# Patient Record
Sex: Female | Born: 1955 | Race: White | Hispanic: No | Marital: Married
Health system: Southern US, Community
[De-identification: ages and names within clinical notes are randomized; demographics above are authoritative.]

---

## 2000-06-04 ENCOUNTER — Inpatient Hospital Stay (HOSPITAL_COMMUNITY): Admission: RE | Admit: 2000-06-04 | Discharge: 2000-06-06 | Payer: Self-pay | Admitting: Obstetrics and Gynecology

## 2000-06-04 ENCOUNTER — Encounter (INDEPENDENT_AMBULATORY_CARE_PROVIDER_SITE_OTHER): Payer: Self-pay | Admitting: Specialist

## 2006-08-27 ENCOUNTER — Encounter: Admission: RE | Admit: 2006-08-27 | Discharge: 2006-08-27 | Payer: Self-pay | Admitting: Internal Medicine

## 2007-12-29 ENCOUNTER — Encounter: Admission: RE | Admit: 2007-12-29 | Discharge: 2007-12-29 | Payer: Self-pay | Admitting: Gastroenterology

## 2010-10-12 NOTE — Op Note (Signed)
[facility]  Patient:    Emily Vaughn, Emily Vaughn                    MRN: [id number] Proc. Date: 06/04/00 Adm. Date:  [id number] Attending:  Morene Antu                           Operative Report  PREOPERATIVE DIAGNOSIS:       Fibroid uterus.  POSTOPERATIVE DIAGNOSES:      1. Fibroid uterus.                               2. Left broad ligament cyst.  PROCEDURE:                    1. Multiple myomectomies.                               2. Excision of left broad ligament cyst.  SURGEON:                      Sherry A. Rosalio Macadamia, M.D. & Silverio Lay, M.D.  ANESTHESIA:                   General.  INDICATIONS:                  This is a 55 year old G 0, P 0 woman, who has periods regularly with light flow.  The patient has had a known fibroid uterus for several years; however, the patient now complains of increasing pelvic pressure, urinary frequency, and discomfort.  An ultrasound had been performed initially in June 2000, revealing a probable pedunculated fibroid that measures approximately 18.0 cm x 15.0 cm x 9.0 cm.  A fluid collection was also noted anterior to the uterus.  The patient was followed conservatively; however, because of the persistence and size of this fibroid, the patient requested surgical intervention.  She wanted conservative surgery, and not to have her uterus removed.  The patient was treated with Lupron for approximately three months, to decrease the size of her uterus.  The patient is now brought to the operating room for a myomectomy.  FINDINGS:                     Approximately a 16.0 cm pedunculated fibroid. Normal tubes and ovaries.  Multiple small fibroids present.  Left broad ligament cyst.  Significant vasculature in the left broad ligament.  DESCRIPTION OF PROCEDURE:     The patient was brought into the operating room and given adequate general anesthesia.  She was placed in a frog-leg position. Her abdomen and  vagina were washed with Betadine.  A Foley catheter was placed into the bladder. A speculum was placed within the vagina.  The cervix was grasped with a single tooth tenaculum.  A #8 pediatric Foley was introduced into the uterine endometrial cavity.  The Foley was packed in place with Betadine-soaked packing tapes, and the speculum and tenaculum were removed. The patient was taken out of the frog-leg position.  She was draped in a sterile fashion.  A Pfannenstiel incision was made and brought down sharply through the fascia.  Bleeders were cauterized.  The fascia was incised sharply.  The fascia was elevated off of the rectus muscles with sharp  and blunt dissection.  The rectus muscles were separated bluntly.  The peritoneum was identified and entered bluntly.  The peritoneal incision was made superiorly and inferiorly under direct visualization.  The pedunculated fibroid was then delivered through the abdominal cavity.  The base of it was injected with Pitressin.  The serosa at the base of the fibroid was sized and was dissected free off of the fibroid.  A Kelly clamp was then placed across the pedicle.  Using #0 Vicryl and interrupted stitches the pedicle was closed at the base for hemostasis.  A large venous lake system was present in the left broad ligament which had drained this fibroid.  The serosa was closed with #3-0 Vicryl in a baseball-type stitch.  There was a small fibroid present on the anterior wall of the uterus.  This was incised and dissected free.  The serosa was closed with #3-0 Vicryl in a baseball stitch, after cauterizing the base for hemostasis.  There was a cyst present in the left broad ligament. The anterior leaf of the broad ligament was incised, and the cyst was dissected with blunt and sharp dissection.  The cyst was ruptured during the dissection, and as much of the cyst wall was removed as possible.  Smaller bleeders were cauterized.  A small area of bleeding  was clamped with a tonsil, and it was free-tied with #0 Vicryl free tie.  There was a small cyst beneath the left fallopian tube.  This also was excised.  There was some bleeding present and some venous system after excision.  This was stopped by carefully placing a #3-0 Vicryl mattress suture beneath the fallopian tube, and adequate hemostasis was present.  A small fibroid was cauterized off of the wall of the uterus, after having packed off the bowel completely without difficulty.  A Balfour retractor had been placed within the abdomen prior, after the removal of the pedunculated fibroid for adequate exposure.  Some adhesions were dissected behind the uterus.  A fibroid was present on the right side wall of the uterus.  This was infiltrated with Pitressin.  The serosa was incised.  It was grasped with a towel clamp, and the fibroid was dissected free and cauterized away from the uterus.  The serosa was then closed with #3-0 Vicryl in a baseball-type stitch.  Adequate hemostasis was present throughout.  The indigo carmine dye was injected through the pediatric Foley in the uterus, and both tubes were shown to have free flow.  The abdominal cavity was irrigated with large amounts of warm saline.  All packs were removed from the abdomen. The Balfour retractor was removed.  The peritoneal edges were cauterized. Bleeders underneath the fascia were cauterized.  The fascia was then closed with #0 Vicryl in two running stitches, running laterally to the midline.  The incision was irrigated.  Bleeders were cauterized.  The skin was infiltrated with 0.25% Marcaine.  The skin incision was closed with staples.  A sterile bandage was placed over the wound.  The packing and pediatric Foley were removed from the vagina.  The patient was then awakened.  She was extubated and moved from the operating room table to a stretcher in stable condition.  COMPLICATIONS:                None.  ESTIMATED BLOOD  LOSS:         150 cc. DD:  06/04/00 TD:  06/04/00 Job: [id number]

## 2010-10-12 NOTE — Discharge Summary (Signed)
[facility]  Patient:    Emily Vaughn, Emily Vaughn                    MRN: [id number] Adm. Date:  [id number] Disc. Date: [id number] Attending:  Morene Antu                           Discharge Summary  PROBLEM:                      Fibroid uterus and left broad ligament cyst.  SUBJECTIVE:                   The patient is a 55 year old, G0, P0, woman who has regular periods with light flow. The patient has had a known fibroid for several years. However, the patient complains of increasing pelvic pain, urinary frequency, and discomfort. Ultrasound revealed a fibroid measuring 18 x 15 x 9 cm. Because of the size of the fibroid and the patients symptoms, the patient is brought to the operating room for myomectomy.  OBJECTIVE:  PHYSICAL EXAMINATION:  HEENT:                        Within normal limits.  NECK:                         Without any lymphadenopathy. Thyroid without nodules.  CHEST:                        Clear to auscultation.  HEART:                        Regular rhythm without murmur.  BREASTS:                      Without mass.  BACK:                         CVA nontender.  ABDOMEN:                      Soft, nontender, with a 16-week size uterus.  PELVIC:                       External genitalia within normal limits. Cervix within normal limits. Uterus anteflexed, irregular, 16- to 18-week size. Adnexa without mass.  HOSPITAL COURSE:              The patient was admitted and brought to the operating room where a myomectomy was performed without difficulty. The patient was also found to have a left broad ligament cyst which was removed. Multiple small fibroids were removed as well. Postoperatively, the patient did well. She remained afebrile. Her vital signs remained stable.  Preoperative hematocrit was 41.1. Postoperative hematocrit on her first postoperative day was 38.4. Urinalysis was within normal limits.  The patient  was discharged to home on her second postoperative day.  ASSESSMENT:                   Stable, status post multiple myomectomy.  PLAN:                         Follow up in the office in two weeks. The  patient will call if she has a temperature greater than 101, severe pain, or heavy bleeding. DD:  07/16/00 TD:  07/17/00 Job: [id number]

## 2015-02-27 ENCOUNTER — Other Ambulatory Visit: Payer: Self-pay | Admitting: Internal Medicine

## 2015-02-27 DIAGNOSIS — E041 Nontoxic single thyroid nodule: Secondary | ICD-10-CM

## 2015-03-01 ENCOUNTER — Ambulatory Visit
Admission: RE | Admit: 2015-03-01 | Discharge: 2015-03-01 | Disposition: A | Discharge status: Home / Self Care | Payer: No Typology Code available for payment source | Source: Ambulatory Visit | Attending: Internal Medicine | Admitting: Internal Medicine

## 2015-03-01 DIAGNOSIS — E041 Nontoxic single thyroid nodule: Secondary | ICD-10-CM

## 2015-03-09 ENCOUNTER — Other Ambulatory Visit: Payer: Self-pay | Admitting: Internal Medicine

## 2015-03-09 DIAGNOSIS — E041 Nontoxic single thyroid nodule: Secondary | ICD-10-CM

## 2015-03-21 ENCOUNTER — Ambulatory Visit
Admission: RE | Admit: 2015-03-21 | Discharge: 2015-03-21 | Disposition: A | Discharge status: Home / Self Care | Payer: No Typology Code available for payment source | Source: Ambulatory Visit | Attending: Internal Medicine | Admitting: Internal Medicine

## 2015-03-21 ENCOUNTER — Other Ambulatory Visit (HOSPITAL_COMMUNITY)
Admission: RE | Admit: 2015-03-21 | Discharge: 2015-03-21 | Disposition: A | Discharge status: Home / Self Care | Payer: No Typology Code available for payment source | Source: Ambulatory Visit | Attending: Physician Assistant | Admitting: Physician Assistant

## 2015-03-21 DIAGNOSIS — E041 Nontoxic single thyroid nodule: Secondary | ICD-10-CM

## 2015-03-21 NOTE — Procedures (Signed)
Using direct ultrasound guidance, 4 passes were made using needles into the nodule within the right lobe of the thyroid.   Ultrasound was used to confirm needle placements on all occasions.   Specimens were sent to Pathology for analysis.   Jeyli Zwicker S [place] Witz PA-C 03/21/2015 1:43 PM   

## 2016-12-12 IMAGING — US US SOFT TISSUE HEAD/NECK
1 series · 14 of 25 positions shown · non-contrast
Comparison: None.

CLINICAL DATA: Thyroid nodule, goiter

EXAM:
THYROID ULTRASOUND
TECHNIQUE: Ultrasound examination of the thyroid gland and adjacent soft
tissues was performed.

[Series 1: us soft tissue head/neck · 0.08mm/px · 14 of 48 slices shown]
[im 1/48]
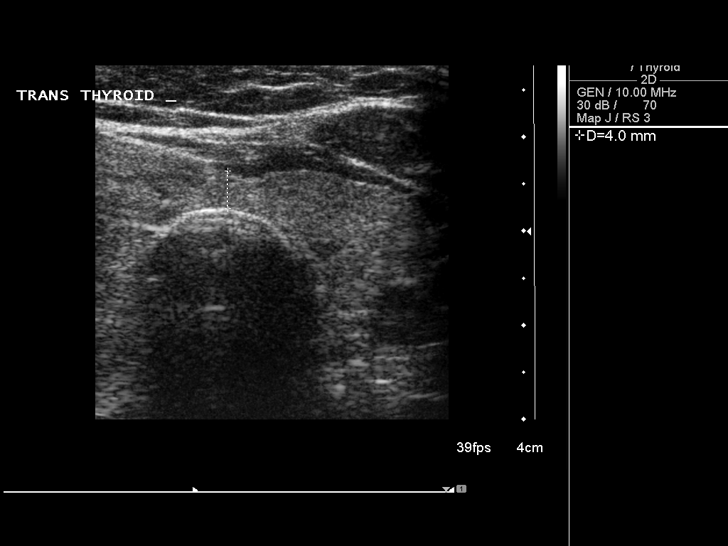
[im 4/48]
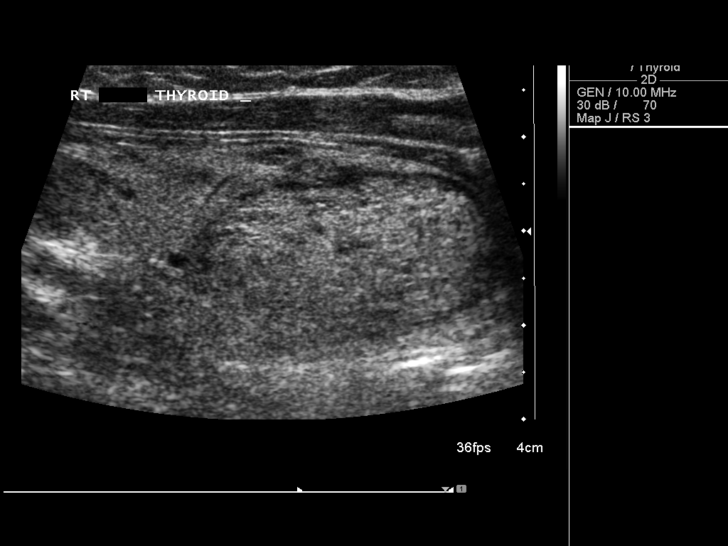
[im 8/48]
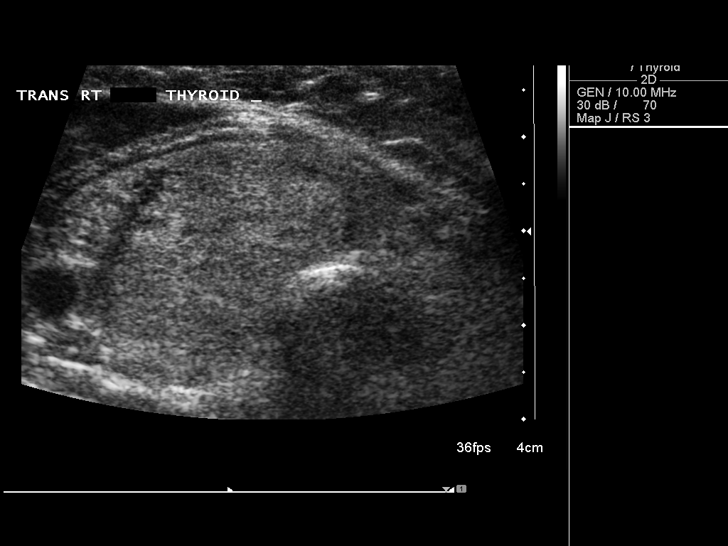
[im 12/48]
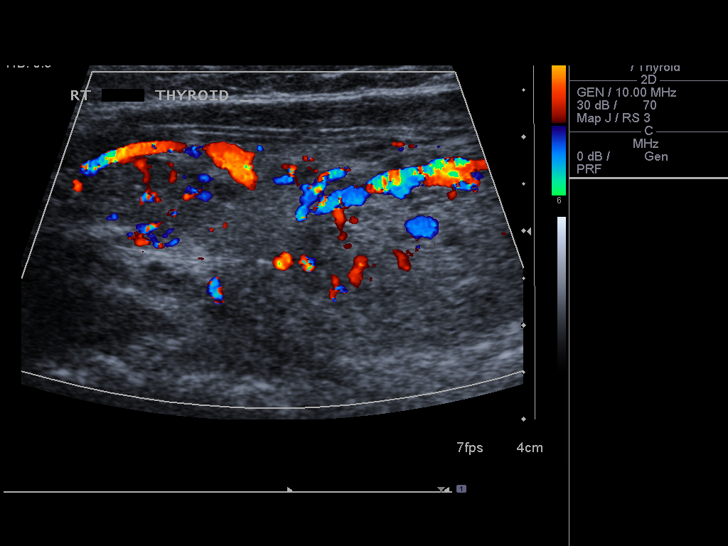
[im 16/48]
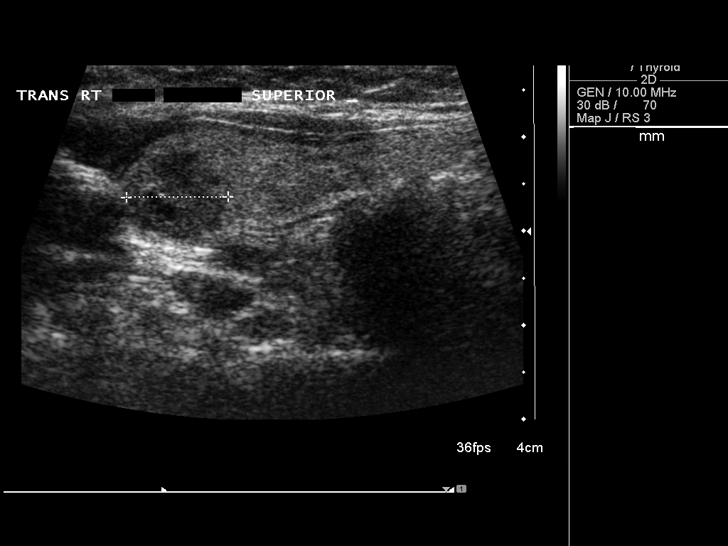
[im 18/48]
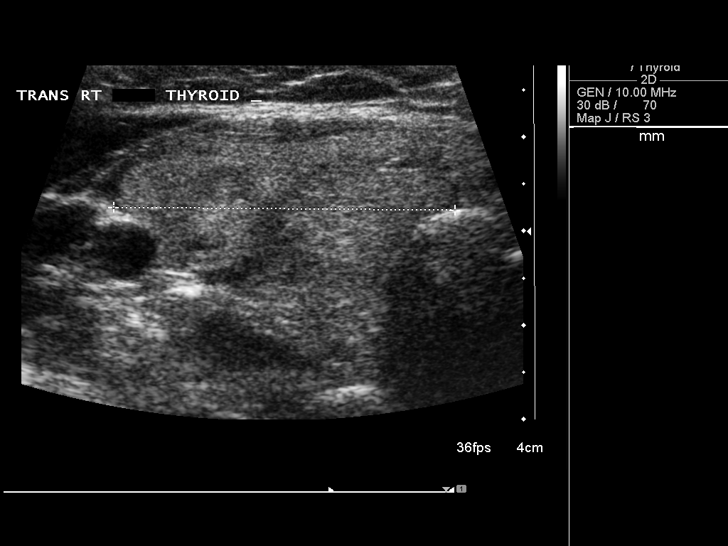
[im 22/48]
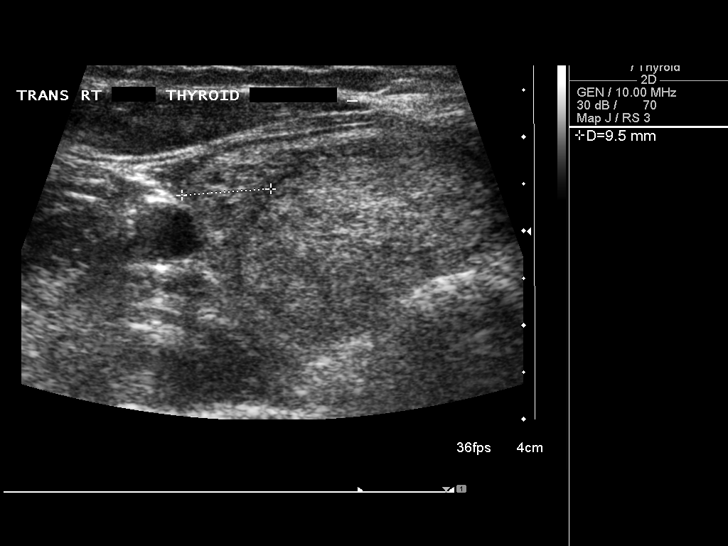
[im 26/48]
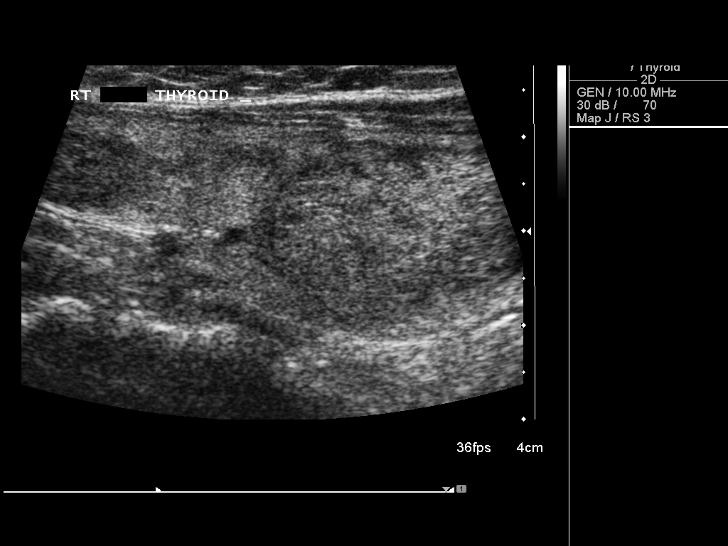
[im 30/48]
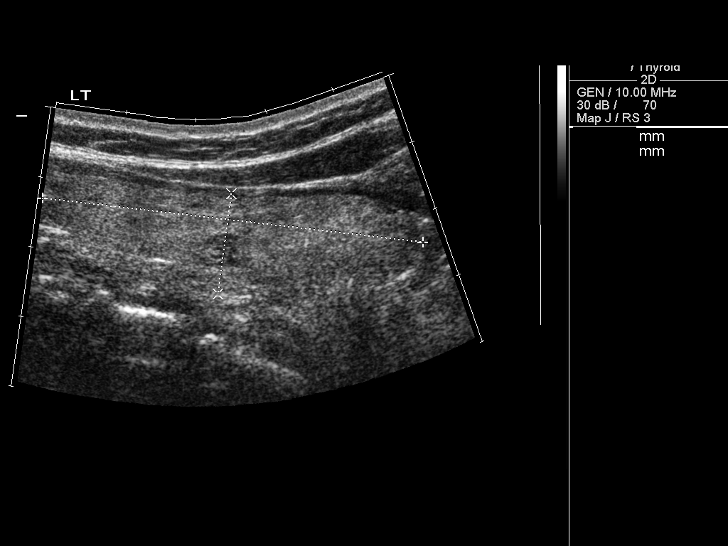
[im 32/48]
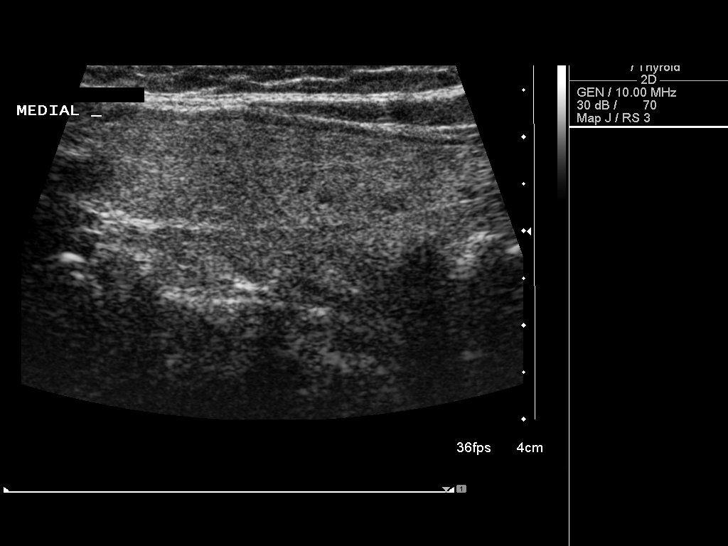
[im 36/48]
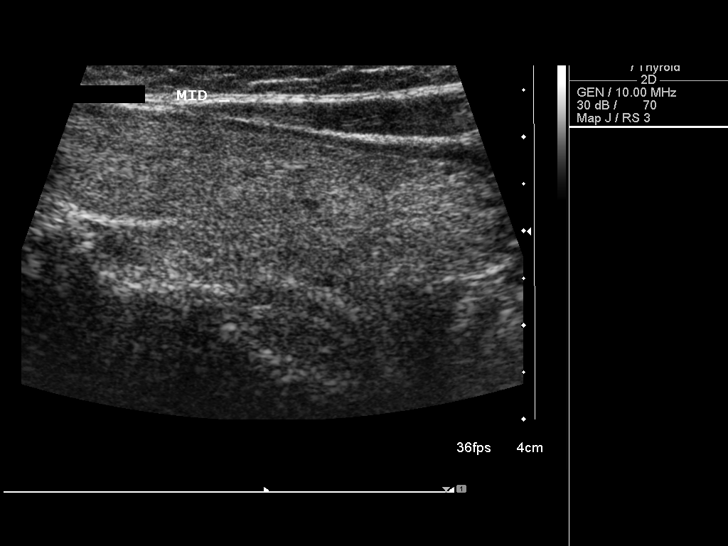
[im 40/48]
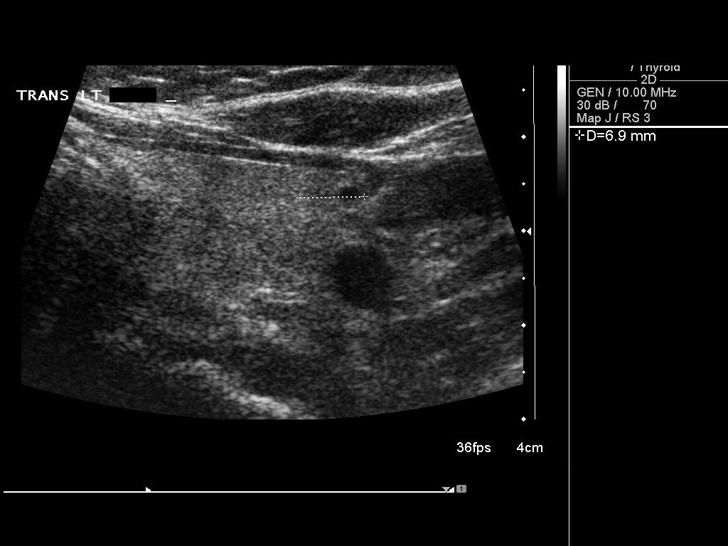
[im 44/48]
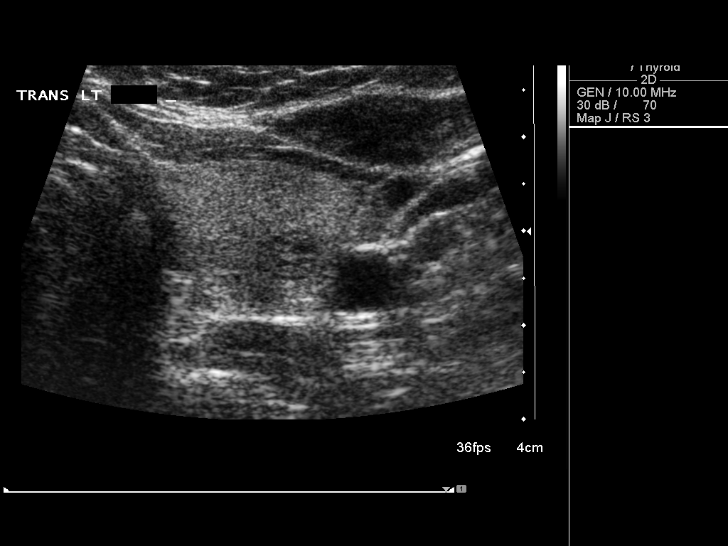
[im 48/48]
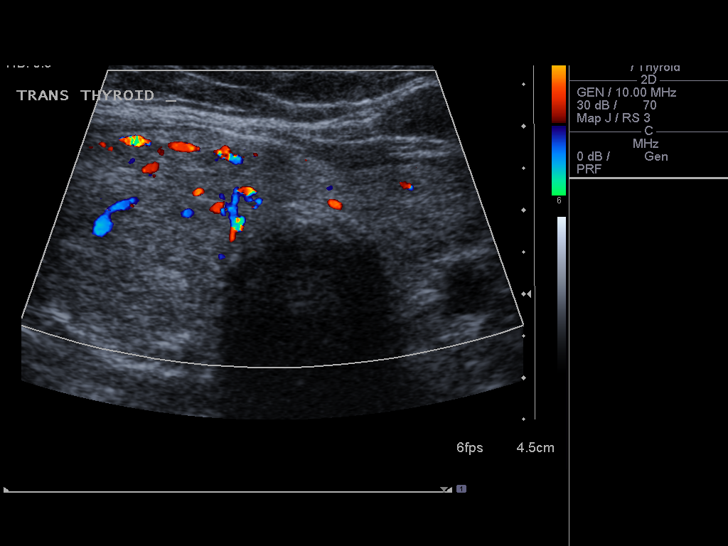

[14 of 25 positions shown; findings below may reference images not displayed]

FINDINGS: Right thyroid lobe

Measurements: 61 x 23 x 36 mm. There is a dominant 35 x 19 x 25 mm
solid nodule, inferior pole. Is a hypoechoic 11 x 9 x 11 mm nodule,
superior pole. There is an 18 x 8 x 10 mm nodule, mid lobe.

Left thyroid lobe

Measurements: 54 x 14 x 25 mm. 9 x 6 x 7 mm complex nodule, mid
lobe. Nearly isoechoic 9 x 7 x 7 mm nodule, inferior pole.

Isthmus

Thickness: 4 mm.  No nodules visualized.

Lymphadenopathy

None visualized.
IMPRESSION: 1. Thyromegaly with bilateral nodules. Dominant right lesions meet
consensus criteria for biopsy. Ultrasound-guided fine needle
aspiration should be considered, as per the consensus statement:
Management of Thyroid Nodules Detected at US: Society of
Radiologists in Ultrasound Consensus Conference Statement. Radiology

## 2017-01-01 IMAGING — US US THYROID BIOPSY
1 series · 14 of 18 positions shown · non-contrast
Comparison: Ultrasound done 03/01/2015.

CLINICAL DATA: Dominant right thyroid nodule which measures 35 x 19
x 25 mm in the inferior pole. Request for ultrasound guided fine
needle aspirate biopsy.

EXAM:
ULTRASOUND GUIDED NEEDLE ASPIRATE BIOPSY OF THE THYROID GLAND

[Series 1: us thyroid biopsy · 0.08mm/px · 18 acquisitions, 14 frames shown]
[im 1/18]
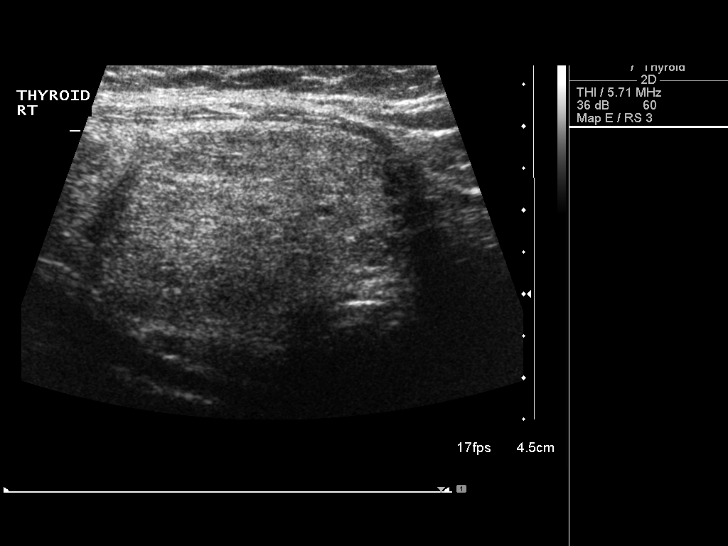
[im 2/18]
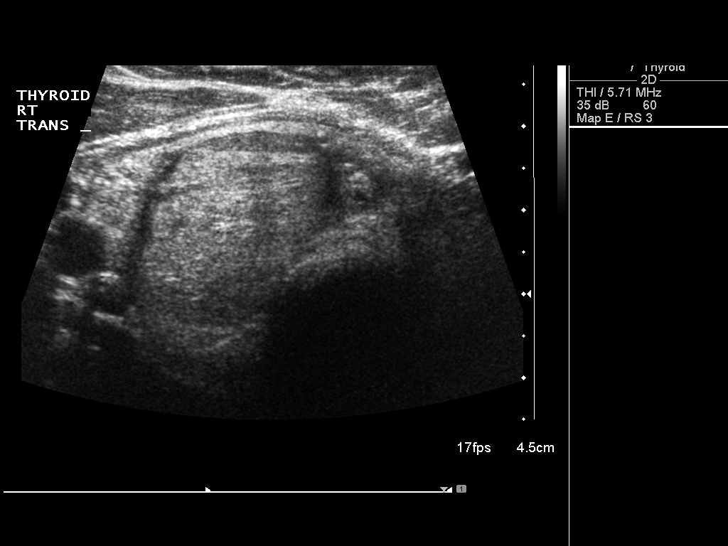
[im 4/18]
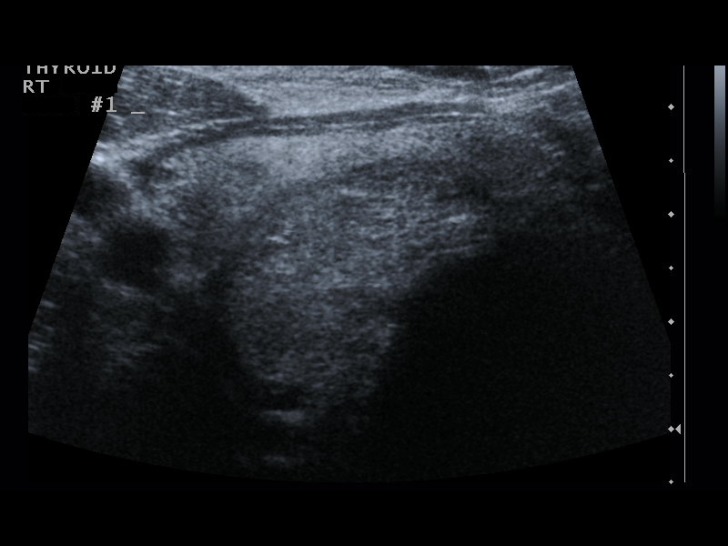
[im 5/18]
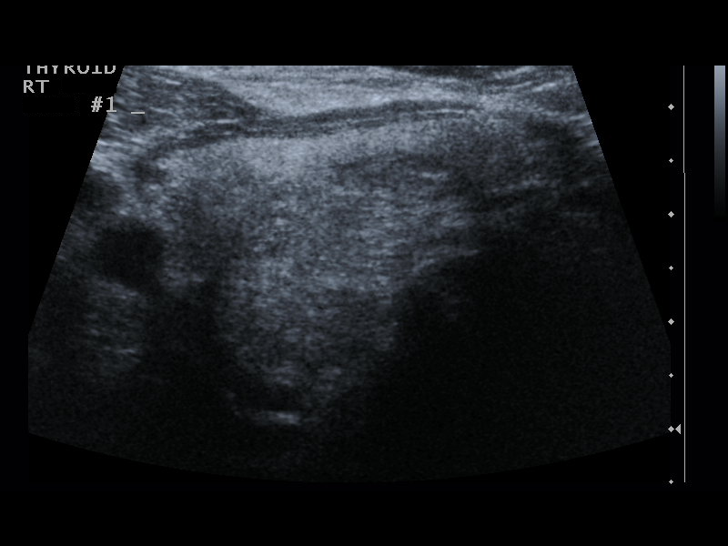
[im 6/18]
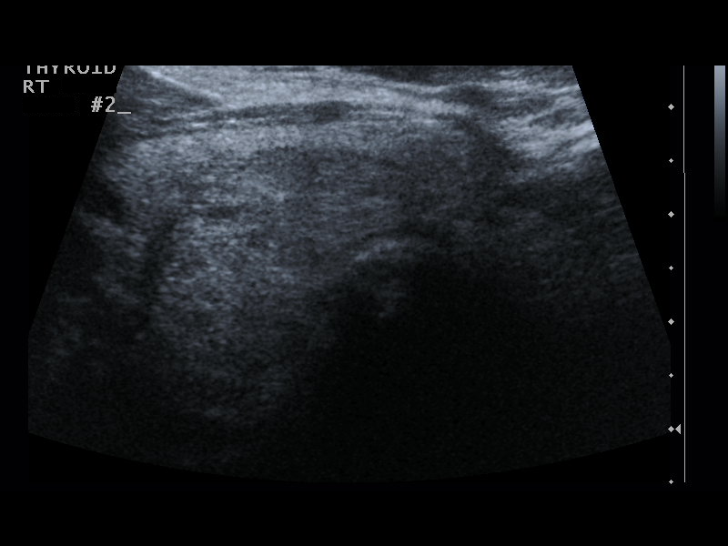
[im 8/18]
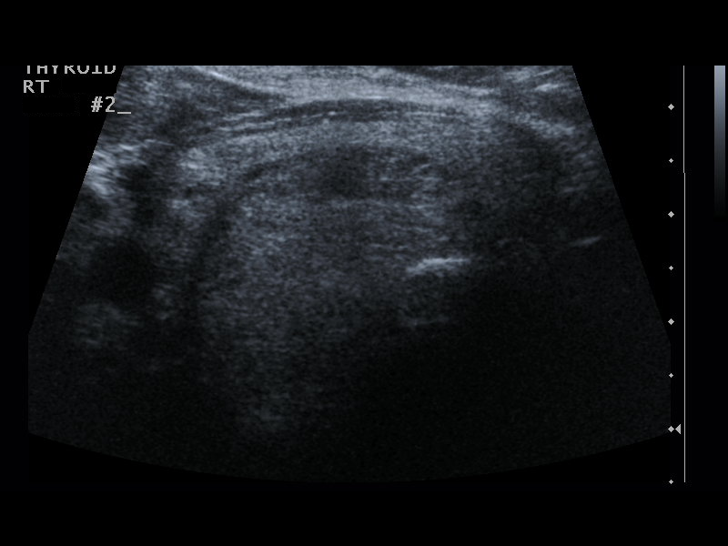
[im 9/18]
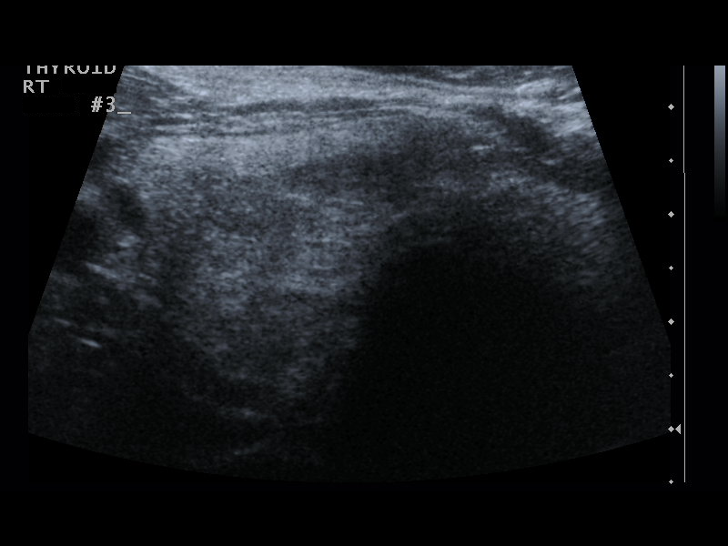
[im 10/18]
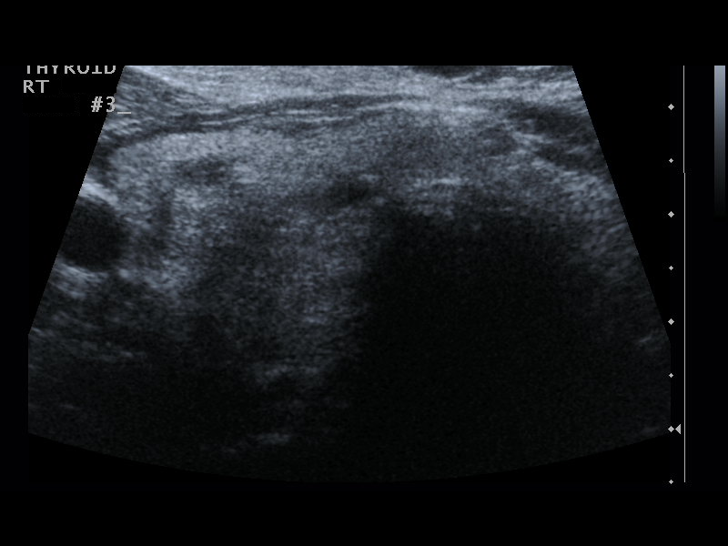
[im 11/18]
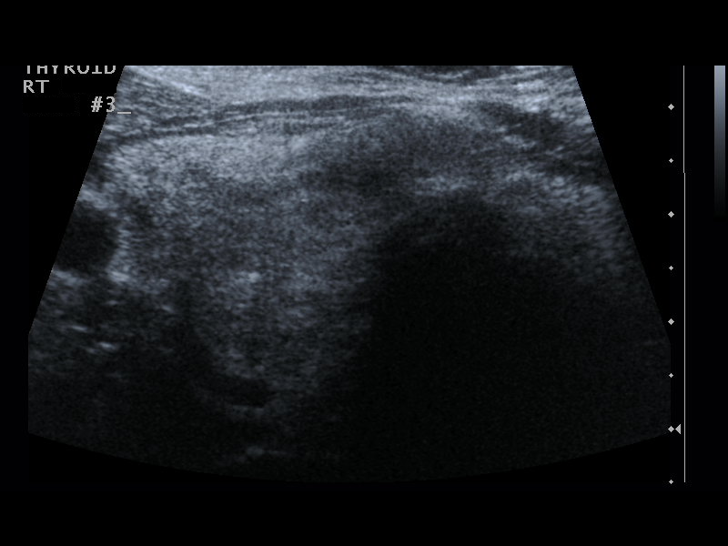
[im 13/18]
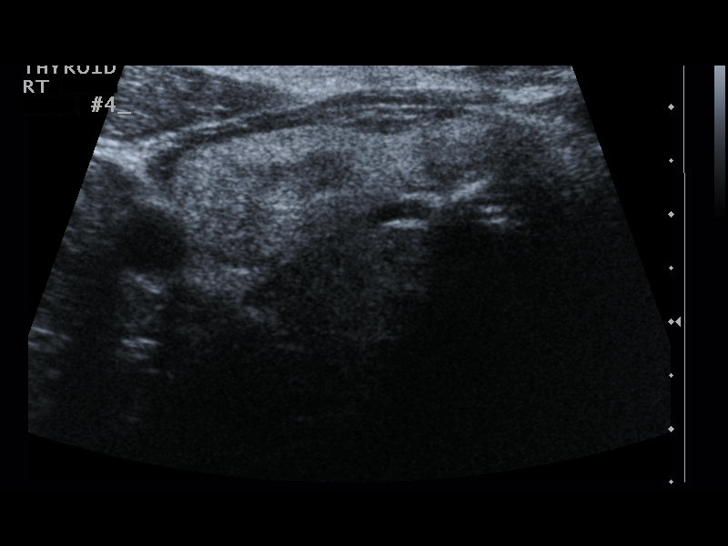
[im 14/18]
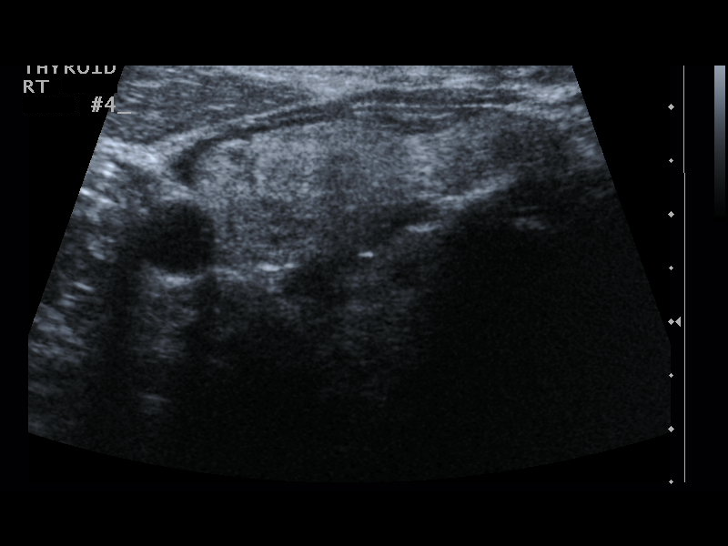
[im 15/18]
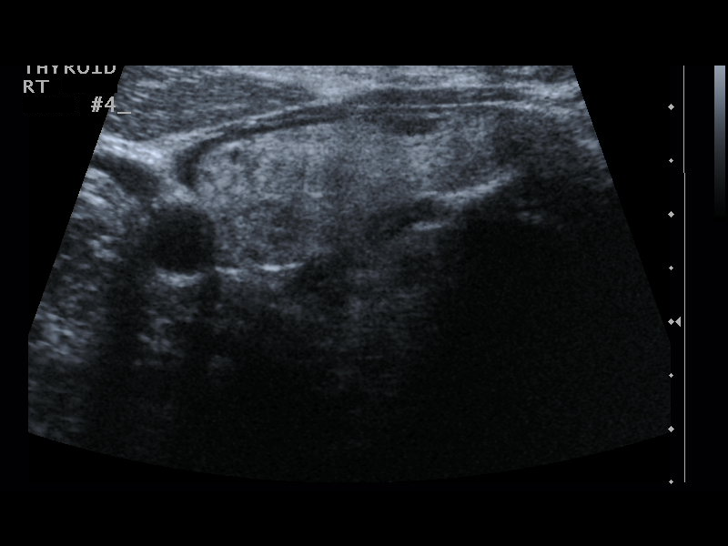
[im 17/18]
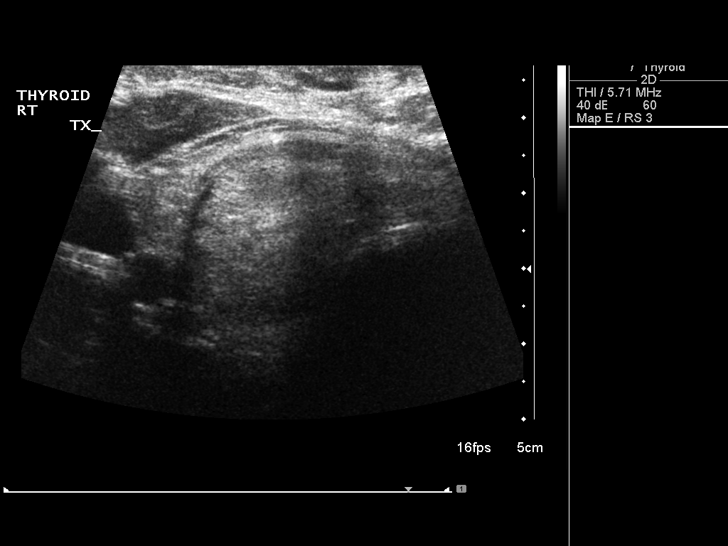
[im 18/18]
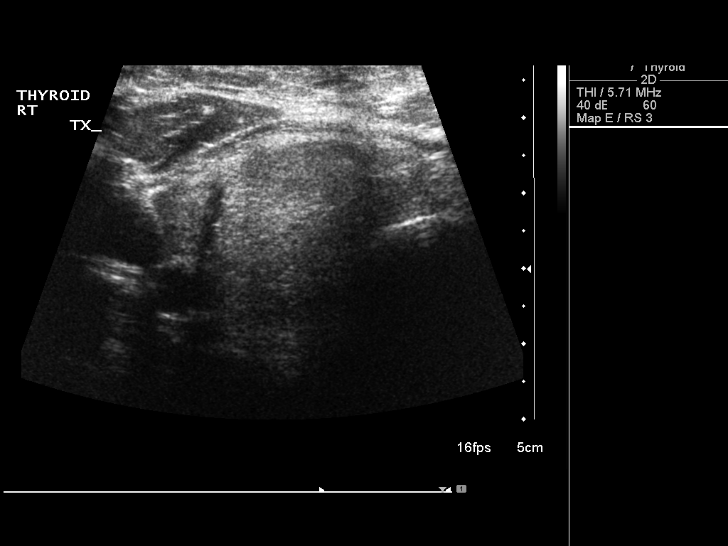

[14 of 18 positions shown; findings below may reference images not displayed]

PROCEDURE:
Thyroid biopsy was thoroughly discussed with the patient and
questions were answered. The benefits, risks, alternatives, and
complications were also discussed. The patient understands and
wishes to proceed with the procedure. Written consent was obtained.

Ultrasound was performed to localize and mark an adequate site for
the biopsy. The patient was then prepped and draped in a normal
sterile fashion. Local anesthesia was provided with 1% lidocaine.
Using direct ultrasound guidance, 4 passes were made using needles
into the nodule within the right lobe of the thyroid. Ultrasound was
used to confirm needle placements on all occasions. Specimens were
sent to Pathology for analysis.

Complications:  None immediate
FINDINGS: Imaging confirms needle placement into the dominant right thyroid
nodule
IMPRESSION: Ultrasound guided needle aspirate biopsy performed of the right
thyroid nodule.

## 2017-06-18 ENCOUNTER — Ambulatory Visit
Admission: RE | Admit: 2017-06-18 | Discharge: 2017-06-18 | Disposition: A | Discharge status: Home / Self Care | Payer: No Typology Code available for payment source | Source: Ambulatory Visit | Attending: Internal Medicine | Admitting: Internal Medicine

## 2017-06-18 ENCOUNTER — Other Ambulatory Visit: Payer: Self-pay | Admitting: Internal Medicine

## 2017-06-18 DIAGNOSIS — R059 Cough, unspecified: Secondary | ICD-10-CM

## 2017-06-18 DIAGNOSIS — R05 Cough: Secondary | ICD-10-CM

## 2018-04-02 ENCOUNTER — Other Ambulatory Visit: Payer: Self-pay | Admitting: Gastroenterology

## 2018-04-02 DIAGNOSIS — Z800 Family history of malignant neoplasm of digestive organs: Secondary | ICD-10-CM

## 2018-04-10 ENCOUNTER — Other Ambulatory Visit: Payer: Self-pay | Admitting: Gastroenterology

## 2018-04-10 DIAGNOSIS — Z1211 Encounter for screening for malignant neoplasm of colon: Secondary | ICD-10-CM

## 2018-04-10 DIAGNOSIS — Z800 Family history of malignant neoplasm of digestive organs: Secondary | ICD-10-CM

## 2018-04-14 ENCOUNTER — Ambulatory Visit: Payer: 59

## 2018-05-06 ENCOUNTER — Other Ambulatory Visit: Payer: Self-pay | Admitting: Gastroenterology

## 2018-06-18 ENCOUNTER — Ambulatory Visit (HOSPITAL_COMMUNITY): Payer: 59 | Admitting: Certified Registered Nurse Anesthetist

## 2018-06-18 ENCOUNTER — Encounter (HOSPITAL_COMMUNITY)
Admission: RE | Disposition: A | Discharge status: Home / Self Care | Payer: Self-pay | Source: Home / Self Care | Attending: Gastroenterology

## 2018-06-18 ENCOUNTER — Other Ambulatory Visit: Payer: Self-pay

## 2018-06-18 ENCOUNTER — Encounter (HOSPITAL_COMMUNITY): Payer: Self-pay | Admitting: Gastroenterology

## 2018-06-18 ENCOUNTER — Ambulatory Visit (HOSPITAL_COMMUNITY)
Admission: RE | Admit: 2018-06-18 | Discharge: 2018-06-18 | Disposition: A | Discharge status: Home / Self Care | Payer: 59 | Attending: Gastroenterology | Admitting: Gastroenterology

## 2018-06-18 DIAGNOSIS — Z1211 Encounter for screening for malignant neoplasm of colon: Secondary | ICD-10-CM | POA: Insufficient documentation

## 2018-06-18 DIAGNOSIS — I10 Essential (primary) hypertension: Secondary | ICD-10-CM | POA: Diagnosis not present

## 2018-06-18 DIAGNOSIS — K635 Polyp of colon: Secondary | ICD-10-CM | POA: Diagnosis not present

## 2018-06-18 DIAGNOSIS — Z79890 Hormone replacement therapy (postmenopausal): Secondary | ICD-10-CM | POA: Diagnosis not present

## 2018-06-18 DIAGNOSIS — Z800 Family history of malignant neoplasm of digestive organs: Secondary | ICD-10-CM | POA: Diagnosis not present

## 2018-06-18 DIAGNOSIS — Z880 Allergy status to penicillin: Secondary | ICD-10-CM | POA: Insufficient documentation

## 2018-06-18 DIAGNOSIS — E039 Hypothyroidism, unspecified: Secondary | ICD-10-CM | POA: Insufficient documentation

## 2018-06-18 DIAGNOSIS — Z79899 Other long term (current) drug therapy: Secondary | ICD-10-CM | POA: Insufficient documentation

## 2018-06-18 HISTORY — PX: COLONOSCOPY WITH PROPOFOL: SHX5780

## 2018-06-18 HISTORY — PX: POLYPECTOMY: SHX5525

## 2018-06-18 SURGERY — COLONOSCOPY WITH PROPOFOL
Anesthesia: Monitor Anesthesia Care

## 2018-06-18 MED ORDER — LACTATED RINGERS IV SOLN
INTRAVENOUS | Status: DC
  Administered 2018-06-18: 08:00:00 via INTRAVENOUS

## 2018-06-18 MED ORDER — PROPOFOL 500 MG/50ML IV EMUL
INTRAVENOUS | Status: DC | PRN
  Administered 2018-06-18: 75 ug/kg/min via INTRAVENOUS

## 2018-06-18 MED ORDER — SODIUM CHLORIDE 0.9 % IV SOLN: INTRAVENOUS | Status: DC

## 2018-06-18 MED ORDER — LIDOCAINE 2% (20 MG/ML) 5 ML SYRINGE
INTRAMUSCULAR | Status: DC | PRN
  Administered 2018-06-18: 80 mg via INTRAVENOUS

## 2018-06-18 MED ORDER — PROPOFOL 10 MG/ML IV BOLUS
INTRAVENOUS | Status: AC
  Filled 2018-06-18: qty 60

## 2018-06-18 MED ORDER — PROPOFOL 10 MG/ML IV BOLUS
INTRAVENOUS | Status: DC | PRN
  Administered 2018-06-18 (×3): 10 mg via INTRAVENOUS
  Administered 2018-06-18: 20 mg via INTRAVENOUS
  Administered 2018-06-18: 10 mg via INTRAVENOUS

## 2018-06-18 SURGICAL SUPPLY — 21 items

## 2018-06-18 NOTE — Op Note (Signed)
[facility] Patient Name: Emily Vaughn Procedure Date: 06/18/2018 MRN: [id number] Attending MD: Tresea Mall Dr., MD Date of Birth: 1955/10/26 CSN: [id number] Age: 63 Admit Type: Outpatient Procedure:                Colonoscopy Indications:              Colon cancer screening in patient at increased                            risk: Colorectal cancer in mother, . The patient                            had had a colonoscopy in the past by another                            physician who was unable to pass beyond the                            proximal transverse colon due to extremely tortuous                            colon. For this reason, we decided to do this at                            the hospital and use a small bowel enteroscope.                            This was in lieu of a virtual colonoscopy. Providers:                Llana Aliment. Leotha Westermeyer Dr., MD, Bonney Leitz, [place], Technician, Beryle Beams, Technician,                            Milinda Cave, CRNA Referring MD:             Henri Medal Medicines:                Monitored Anesthesia Care Complications:            No immediate complications. Estimated Blood Loss:     Estimated blood loss: none. Procedure:                Pre-Anesthesia Assessment:                           - Prior to the procedure, a History and Physical                            was performed, and patient medications and                            allergies were reviewed. The patient's tolerance of  previous anesthesia was also reviewed. The risks                            and benefits of the procedure and the sedation                            options and risks were discussed with the patient.                            All questions were answered, and informed consent                            was obtained. Prior Anticoagulants: The patient has               taken no previous anticoagulant or antiplatelet                            agents. ASA Grade Assessment: II - A patient with                            mild systemic disease. After reviewing the risks                            and benefits, the patient was deemed in                            satisfactory condition to undergo the procedure.                           After obtaining informed consent, the colonoscope                            was passed under direct vision. Throughout the                            procedure, the patient's blood pressure, pulse, and                            oxygen saturations were monitored continuously. The                            [id number] ([id number] Olympus enteroscope was                            introduced through the anus and advanced to the the                            cecum, identified by appendiceal orifice and                            ileocecal valve. The patient tolerated the                            procedure well. The quality  of the bowel                            preparation was good. The ileocecal valve,                            appendiceal orifice, and rectum were photographed.                            The patient did have a very long and tortuous colon                            and required abdominal pressure. Even using the                            small bowel enteroscope a large portion of the                            scope was inserted in order to reach the cecum. The                            colonoscopy was technically difficult and complex                            due to a redundant colon, significant looping and a                            tortuous colon. Successful completion of the                            procedure was aided by applying abdominal pressure. Scope In: 8:35:07 AM Scope Out: 9:18:59 AM Scope Withdrawal Time: 0 hours 24 minutes 16 seconds  Total Procedure Duration: 0 hours 43  minutes 52 seconds  Findings:      The perianal and digital rectal examinations were normal.      A 5 mm polyp was found in the distal descending colon. The polyp was       sessile. The polyp was removed with a hot snare. Resection and retrieval       were complete.      The retroflexed view of the distal rectum and anal verge was normal and       showed no anal or rectal abnormalities.      The exam was otherwise without abnormality. Impression:               - One 5 mm polyp in the distal descending colon,                            removed with a hot snare. Resected and retrieved.                           - The distal rectum and anal verge are normal on  retroflexion view.                           - The examination was otherwise normal. Moderate Sedation:      See anesthesia note, no moderate sedation. Recommendation:           - Patient has a contact number available for                            emergencies. The signs and symptoms of potential                            delayed complications were discussed with the                            patient. Return to normal activities tomorrow.                            Written discharge instructions were provided to the                            patient.                           - Resume previous diet.                           - Continue present medications.                           - No aspirin, ibuprofen, naproxen, or other                            non-steroidal anti-inflammatory drugs for 5 days                            after polyp removal.                           - Repeat colonoscopy in 5 years for surveillance. Procedure Code(s):        --- Professional ---                           [phone], Colonoscopy, flexible; with removal of                            tumor(s), polyp(s), or other lesion(s) by snare                            technique Diagnosis Code(s):        --- Professional ---                            D12.4, Benign neoplasm of descending colon                           Z80.0, Family history of malignant  neoplasm of                            digestive organs CPT copyright 2018 American Medical Association. All rights reserved. The codes documented in this report are preliminary and upon coder review may  be revised to meet current compliance requirements. Tresea Mall Dr., MD 06/18/2018 9:43:00 AM This report has been signed electronically. Number of Addenda: 0

## 2018-06-18 NOTE — H&P (Signed)
Subjective:   Patient is a 63 y.o. female presents with strong family history of colon cancer.  Her mother had colon cancer.  10 years ago she had an attempted colonoscopy by Dr. Madilyn Fireman who was unable to pass the transverse colon completely due to a long tortuous colon and she ended up having a barium enema.  Due to her family history she was referred back and we discussed virtual colonoscopy versus another attempted colonoscopy with extra long small bowel enteroscope.  After presenting these options to the patient she decided to go ahead with the colonoscopy.. Procedure including risks and benefits discussed in office.  There are no active problems to display for this patient.  History reviewed. No pertinent past medical history.  History reviewed. No pertinent surgical history.  Medications Prior to Admission  Medication Sig Dispense Refill Last Dose  . atorvastatin (LIPITOR) 20 MG tablet Take 20 mg by mouth daily.   06/17/2018 at Unknown time  . cetirizine (ZYRTEC) 10 MG tablet Take 10 mg by mouth daily.   06/17/2018 at Unknown time  . cholecalciferol (VITAMIN D3) 25 MCG (1000 UT) tablet Take 1,000 Units by mouth daily.   06/18/2018 at Unknown time  . levothyroxine (SYNTHROID, LEVOTHROID) 50 MCG tablet Take 50 mcg by mouth daily before breakfast.   06/18/2018 at Unknown time  . lisinopril (PRINIVIL,ZESTRIL) 20 MG tablet Take 20 mg by mouth daily.   06/18/2018 at Unknown time  . LUTEIN PO Take 1 tablet by mouth daily.   06/18/2018 at Unknown time   Allergies  Allergen Reactions  . Penicillins     Did it involve swelling of the face/tongue/throat, SOB, or low BP? No Did it involve sudden or severe rash/hives, skin peeling, or any reaction on the inside of your mouth or nose? No Did you need to seek medical attention at a hospital or doctor's office? No When did it last happen?childhood If all above answers are "NO", may proceed with cephalosporin use.     Social History   Tobacco Use   . Smoking status: Not on file  Substance Use Topics  . Alcohol use: Not on file    History reviewed. No pertinent family history.   Objective:   Patient Vitals for the past 8 hrs:  BP Temp Temp src Pulse Resp SpO2 Height Weight  06/18/18 0748 135/66 98.5 F (36.9 C) Oral 71 17 98 % 5\' 4"  (1.626 m) 76.2 kg   No intake/output data recorded. No intake/output data recorded.   See MD Preop evaluation      Assessment:   1.  Strong family history of colon cancer.  Plan:   We will proceed with colonoscopy under propofol sedation utilizing the small bowel enteroscope.  The patient does understand that this could be unsuccessful and she may need some imaging in addition.

## 2018-06-18 NOTE — Transfer of Care (Signed)
Immediate Anesthesia Transfer of Care Note  Patient: Emily Vaughn  Procedure(s) Performed: COLONOSCOPY WITH PROPOFOL (N/A ) POLYPECTOMY  Patient Location: Endoscopy Unit  Anesthesia Type:MAC  Level of Consciousness: drowsy and patient cooperative  Airway & Oxygen Therapy: Patient Spontanous Breathing and Patient connected to face mask oxygen  Post-op Assessment: Report given to RN and Post -op Vital signs reviewed and stable  Post vital signs: Reviewed and stable  Last Vitals:  Vitals Value Taken Time  BP    Temp    Pulse    Resp    SpO2      Last Pain:  Vitals:   06/18/18 0748  TempSrc: Oral  PainSc: 0-No pain         Complications: No apparent anesthesia complications

## 2018-06-18 NOTE — Anesthesia Preprocedure Evaluation (Addendum)
Anesthesia Evaluation  Patient identified by MRN, date of birth, ID band Patient awake    Reviewed: Allergy & Precautions, NPO status , Patient's Chart, lab work & pertinent test results  Airway Mallampati: II  TM Distance: >3 FB Neck ROM: Full    Dental  (+) Teeth Intact, Dental Advisory Given   Pulmonary neg pulmonary ROS,    breath sounds clear to auscultation       Cardiovascular hypertension, Pt. on medications  Rhythm:Regular Rate:Normal     Neuro/Psych negative neurological ROS     GI/Hepatic negative GI ROS, Neg liver ROS,   Endo/Other  Hypothyroidism   Renal/GU negative Renal ROS     Musculoskeletal negative musculoskeletal ROS (+)   Abdominal Normal abdominal exam  (+)   Peds  Hematology   Anesthesia Other Findings   Reproductive/Obstetrics                            Anesthesia Physical Anesthesia Plan  ASA: II  Anesthesia Plan: MAC   Post-op Pain Management:    Induction: Intravenous  PONV Risk Score and Plan: 1 and Propofol infusion  Airway Management Planned: Natural Airway and Simple Face Mask  Additional Equipment: None  Intra-op Plan:   Post-operative Plan:   Informed Consent: I have reviewed the patients History and Physical, chart, labs and discussed the procedure including the risks, benefits and alternatives for the proposed anesthesia with the patient or authorized representative who has indicated his/her understanding and acceptance.       Plan Discussed with: CRNA  Anesthesia Plan Comments:        Anesthesia Quick Evaluation

## 2018-06-18 NOTE — Anesthesia Postprocedure Evaluation (Signed)
Anesthesia Post Note  Patient: Emily Vaughn  Procedure(s) Performed: COLONOSCOPY WITH PROPOFOL (N/A ) POLYPECTOMY     Patient location during evaluation: PACU Anesthesia Type: MAC Level of consciousness: awake and alert Pain management: pain level controlled Vital Signs Assessment: post-procedure vital signs reviewed and stable Respiratory status: spontaneous breathing, nonlabored ventilation, respiratory function stable and patient connected to nasal cannula oxygen Cardiovascular status: stable and blood pressure returned to baseline Postop Assessment: no apparent nausea or vomiting Anesthetic complications: no    Last Vitals:  Vitals:   06/18/18 0940 06/18/18 0950  BP: 114/63 120/69  Pulse: 63 62  Resp: 16 16  Temp:    SpO2: 100% 100%    Last Pain:  Vitals:   06/18/18 0930  TempSrc:   PainSc: 0-No pain                 Effie Berkshire

## 2018-06-18 NOTE — Discharge Instructions (Addendum)
You should repeat the colonoscopy in the next few years.  Our office will notify you by mail when it is time.  Please call at [phone] for any problems No aspirin, ibuprofen or other NSAID medications for 5 days. Office will send note or call when pathology results are obtained. Colonoscopy will be repeated based on the pathology results.

## 2018-06-22 ENCOUNTER — Encounter (HOSPITAL_COMMUNITY): Payer: Self-pay | Admitting: Gastroenterology

## 2019-04-01 IMAGING — CR DG CHEST 2V
2 series · 2 of 2 positions shown · non-contrast
Comparison: Thoracic spine radiograph August 26, 2016

CLINICAL DATA: Productive cough since March 2017, completed 3
rounds of antibiotics. Nonsmoker.

EXAM:
CHEST  2 VIEW

[w chest pa]
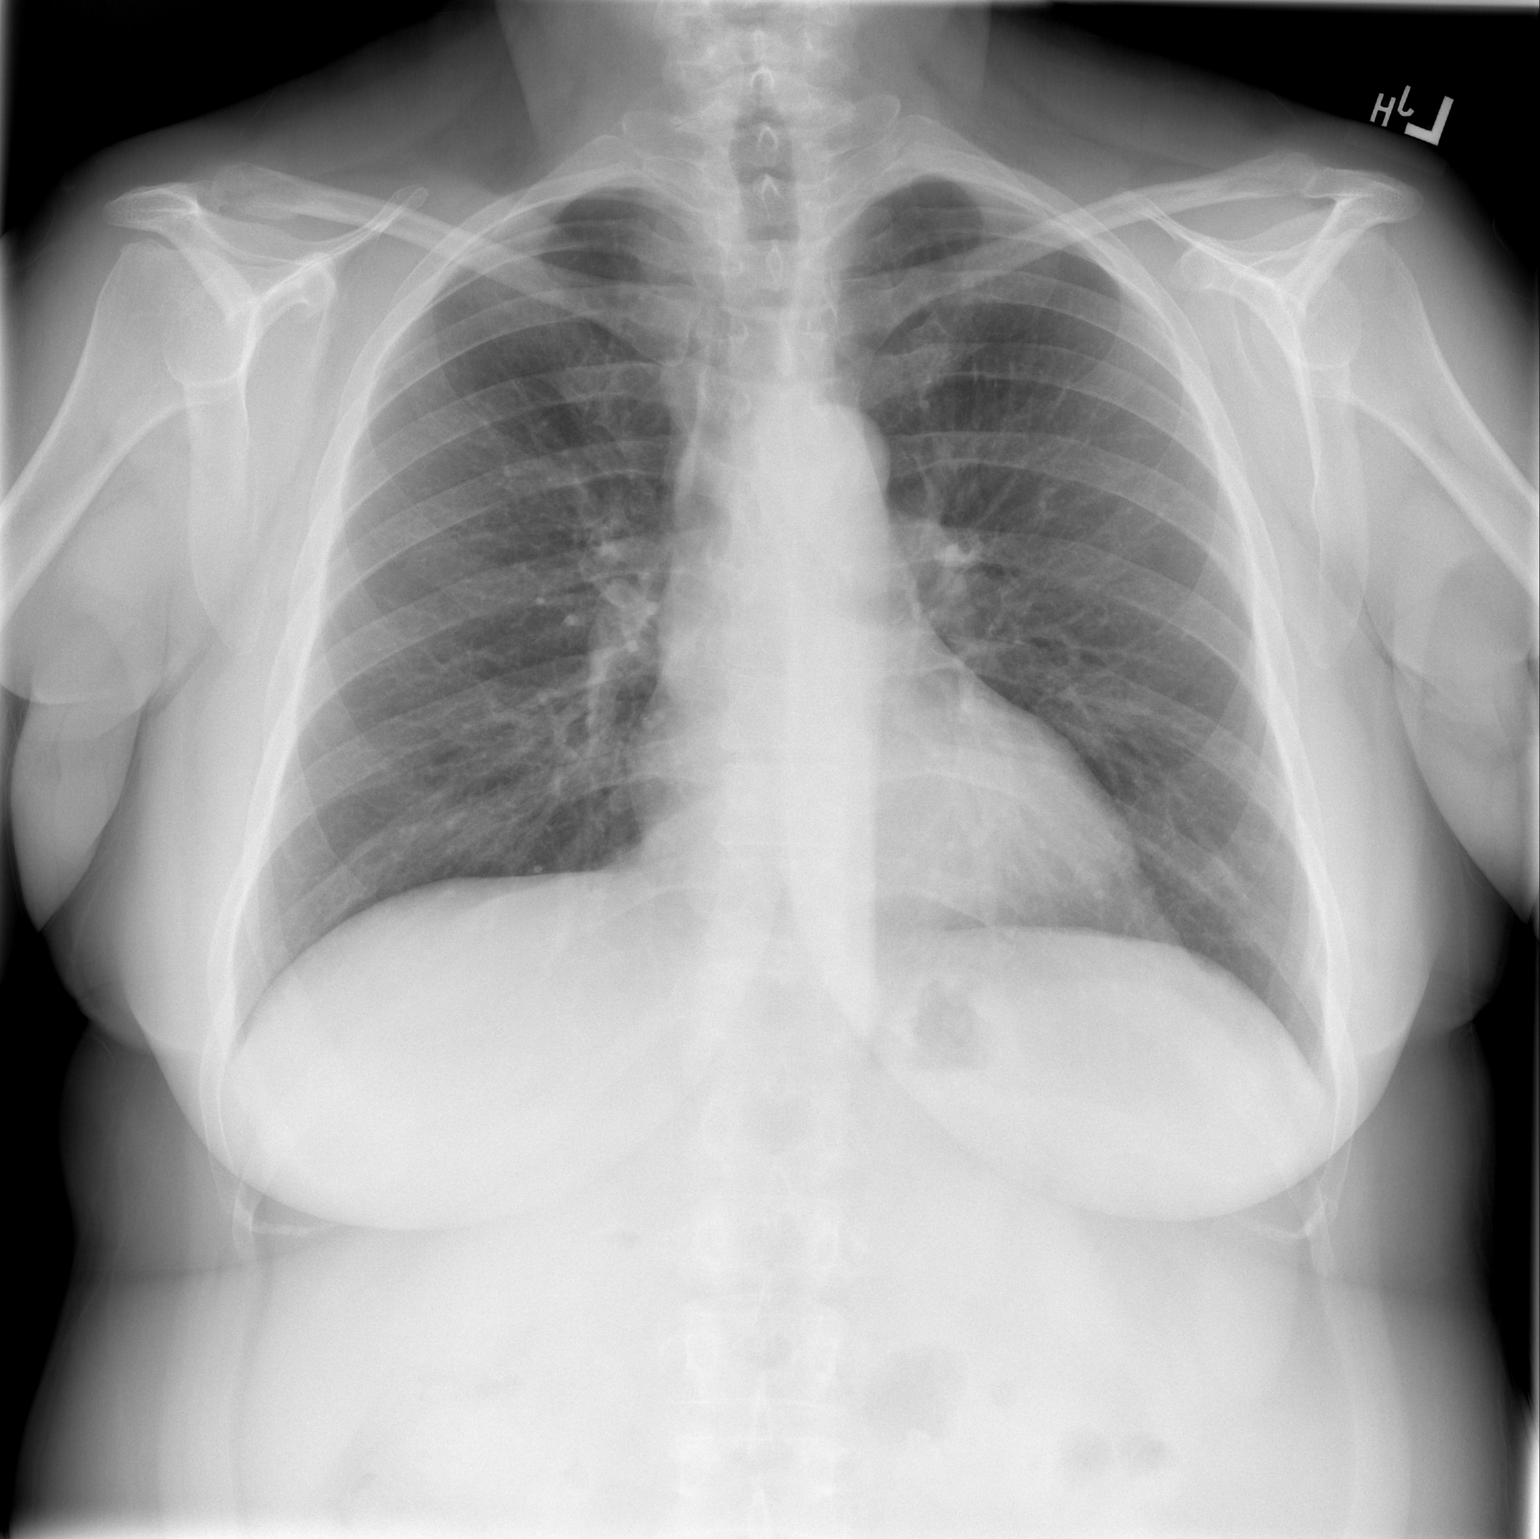

[w chest lat]
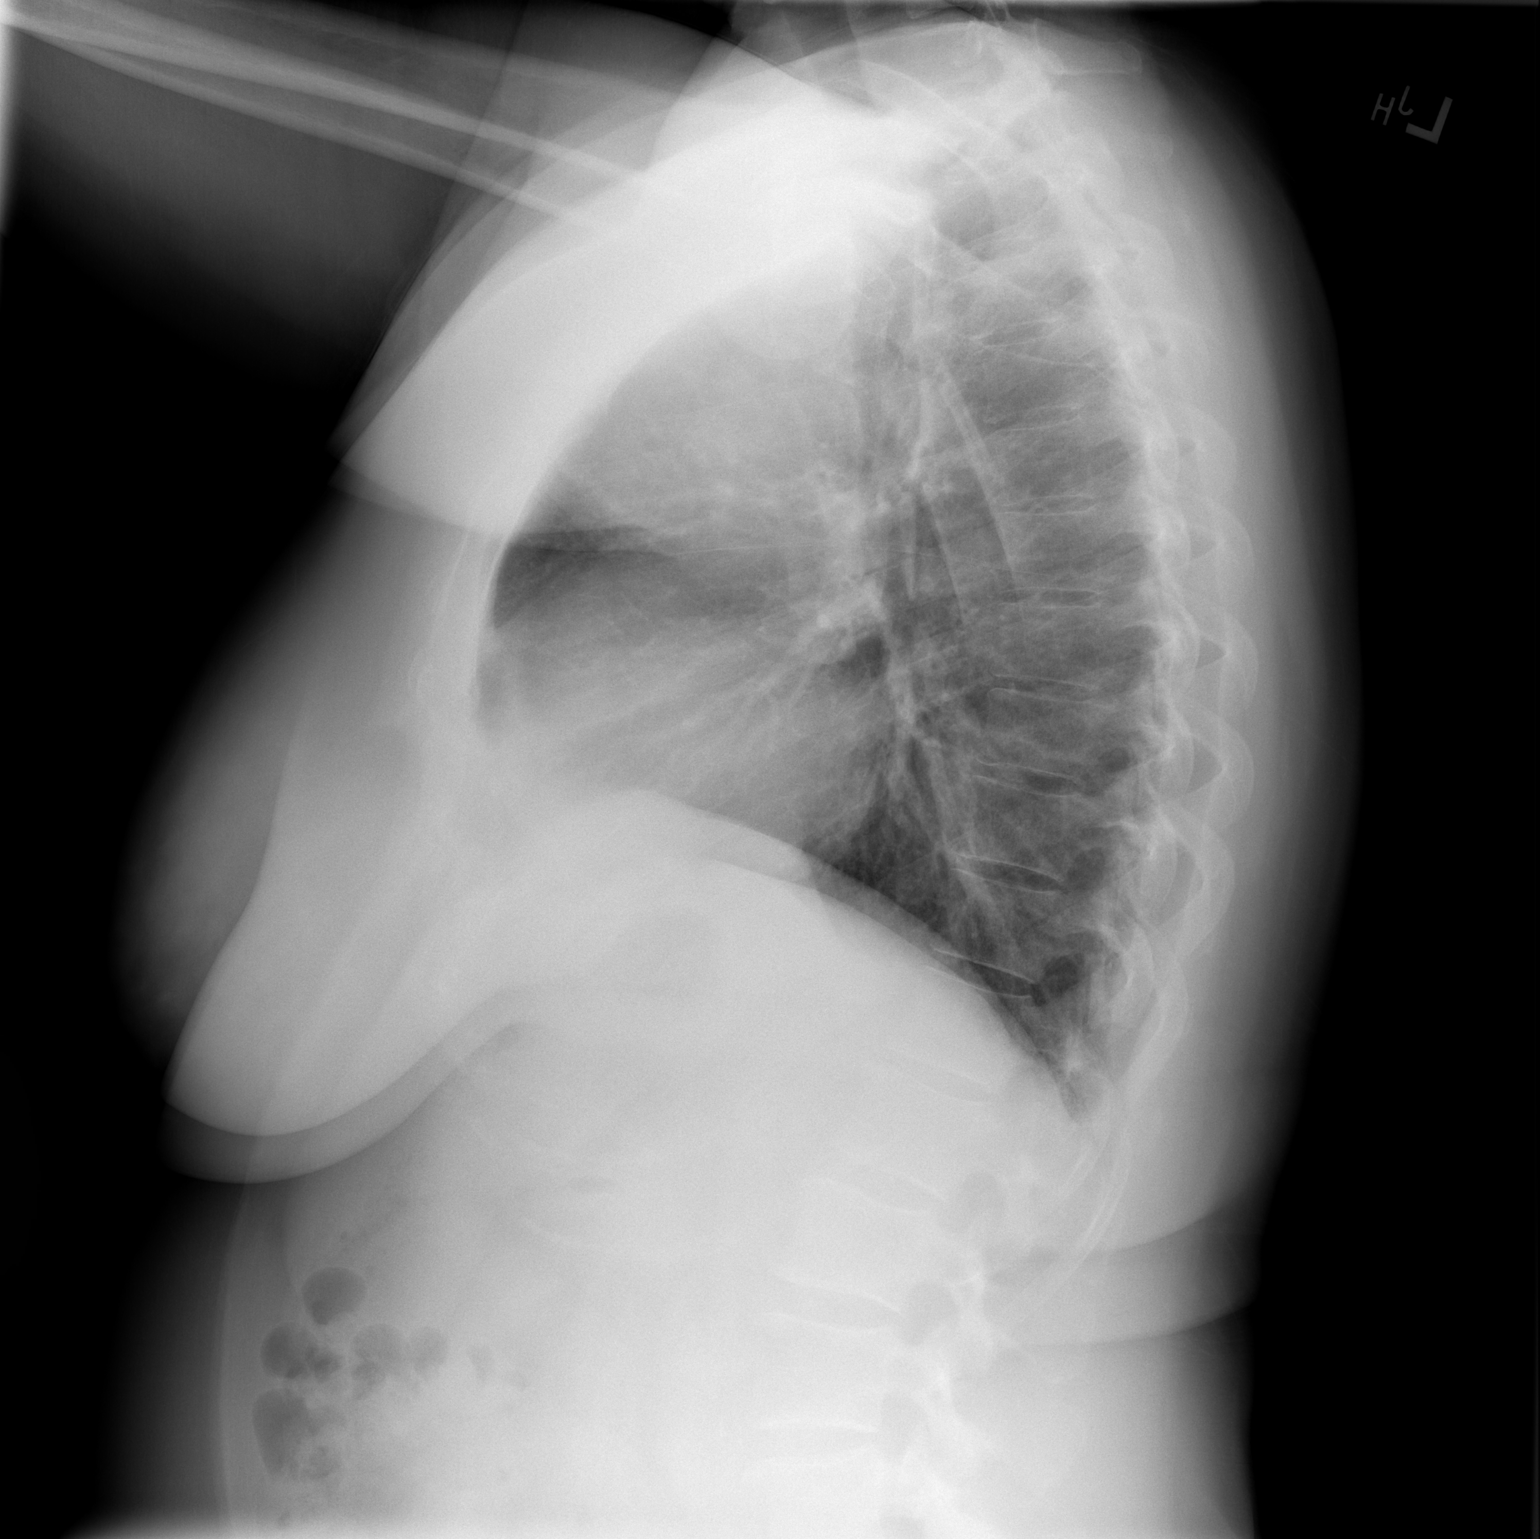

[2 of 2 positions shown; findings below may reference images not displayed]

FINDINGS: Cardiomediastinal silhouette is normal. No pleural effusions or
focal consolidations. Trachea projects midline and there is no
pneumothorax. Soft tissue planes and included osseous structures are
non-suspicious.
IMPRESSION: Negative.

## 2019-08-26 ENCOUNTER — Ambulatory Visit: Payer: 59 | Attending: Internal Medicine

## 2019-08-26 DIAGNOSIS — Z23 Encounter for immunization: Secondary | ICD-10-CM

## 2019-08-26 NOTE — Progress Notes (Signed)
   Cov[facility] Vaccination [facility]  Name:  CONNELLY SPRUELL    MRN: [id number] DOB: 11/08/1955  08/26/2019  Ms. Pavone was observed post Covid-19 immunization for 15 minutes without incident. She was provided with Vaccine Information Sheet and instruction to access the V-Safe system.   Ms. Ponti was instructed to call 911 with any severe reactions post vaccine: Marland Kitchen Difficulty breathing  . Swelling of face and throat  . A fast heartbeat  . A bad rash all over body  . Dizziness and weakness   Immunizations Administered    Name Date Dose VIS Date Route   Pfizer COVID-19 Vaccine 08/26/2019  9:16 AM 0.3 mL 05/07/2019 Intramuscular   Manufacturer: [employer], [employer]   Lot: [id number]   NDC: [address]-2

## 2019-09-20 ENCOUNTER — Ambulatory Visit: Payer: 59 | Attending: Internal Medicine

## 2019-09-20 DIAGNOSIS — Z23 Encounter for immunization: Secondary | ICD-10-CM

## 2019-09-20 NOTE — Progress Notes (Signed)
   Covid-19 Vaccination Clinic  Name:  Emily Vaughn    MRN: [id number] DOB: Feb 09, 1956  09/20/2019  [name] was observed post Covid-19 immunization for 15 minutes without incident. She was provided with Vaccine Information Sheet and instruction to access the V-Safe system.   [name] was instructed to call 911 with any severe reactions post vaccine: Marland Kitchen Difficulty breathing  . Swelling of face and throat  . A fast heartbeat  . A bad rash all over body  . Dizziness and weakness   Immunizations Administered    Name Date Dose VIS Date Route   Pfizer COVID-19 Vaccine 09/20/2019  8:40 AM 0.3 mL 07/21/2018 Intramuscular   Manufacturer: [employer], [employer]   Lot: [id number]   NDC: [address]-9

## 2022-08-07 ENCOUNTER — Other Ambulatory Visit: Payer: Self-pay | Admitting: Internal Medicine

## 2022-08-07 DIAGNOSIS — R1032 Left lower quadrant pain: Secondary | ICD-10-CM

## 2022-08-14 ENCOUNTER — Encounter: Payer: Self-pay | Admitting: Internal Medicine

## 2022-08-15 ENCOUNTER — Ambulatory Visit
Admission: RE | Admit: 2022-08-15 | Discharge: 2022-08-15 | Disposition: A | Discharge status: Home / Self Care | Payer: BC Managed Care – PPO | Source: Ambulatory Visit | Attending: Internal Medicine | Admitting: Internal Medicine

## 2022-08-15 DIAGNOSIS — R1032 Left lower quadrant pain: Secondary | ICD-10-CM

## 2022-08-15 MED ORDER — IOPAMIDOL (ISOVUE-300) INJECTION 61%
100.0000 mL | Freq: Once | INTRAVENOUS | Status: AC | PRN
  Administered 2022-08-15: 100 mL via INTRAVENOUS
